# Patient Record
Sex: Female | Born: 1995 | Race: White | Hispanic: No | Marital: Married | Smoking: Never smoker
Health system: Southern US, Community
[De-identification: ages and names within clinical notes are randomized; demographics above are authoritative.]

## PROBLEM LIST (undated history)

## (undated) DIAGNOSIS — F909 Attention-deficit hyperactivity disorder, unspecified type: Secondary | ICD-10-CM

## (undated) DIAGNOSIS — J45909 Unspecified asthma, uncomplicated: Secondary | ICD-10-CM

## (undated) DIAGNOSIS — R000 Tachycardia, unspecified: Secondary | ICD-10-CM

## (undated) DIAGNOSIS — E063 Autoimmune thyroiditis: Secondary | ICD-10-CM

## (undated) DIAGNOSIS — I10 Essential (primary) hypertension: Secondary | ICD-10-CM

## (undated) DIAGNOSIS — E119 Type 2 diabetes mellitus without complications: Secondary | ICD-10-CM

## (undated) HISTORY — PX: WISDOM TOOTH EXTRACTION: SHX21

---

## 2020-09-29 ENCOUNTER — Emergency Department

## 2020-09-29 ENCOUNTER — Other Ambulatory Visit: Payer: Self-pay

## 2020-09-29 ENCOUNTER — Encounter: Payer: Self-pay | Admitting: Emergency Medicine

## 2020-09-29 ENCOUNTER — Emergency Department
Admission: EM | Admit: 2020-09-29 | Discharge: 2020-09-29 | Disposition: A | Discharge status: Home / Self Care | Attending: Emergency Medicine | Admitting: Emergency Medicine

## 2020-09-29 DIAGNOSIS — S199XXA Unspecified injury of neck, initial encounter: Secondary | ICD-10-CM | POA: Insufficient documentation

## 2020-09-29 DIAGNOSIS — S0990XA Unspecified injury of head, initial encounter: Secondary | ICD-10-CM | POA: Diagnosis not present

## 2020-09-29 DIAGNOSIS — E119 Type 2 diabetes mellitus without complications: Secondary | ICD-10-CM | POA: Diagnosis not present

## 2020-09-29 DIAGNOSIS — I10 Essential (primary) hypertension: Secondary | ICD-10-CM | POA: Insufficient documentation

## 2020-09-29 DIAGNOSIS — Y92410 Unspecified street and highway as the place of occurrence of the external cause: Secondary | ICD-10-CM | POA: Insufficient documentation

## 2020-09-29 DIAGNOSIS — G44319 Acute post-traumatic headache, not intractable: Secondary | ICD-10-CM

## 2020-09-29 DIAGNOSIS — J45909 Unspecified asthma, uncomplicated: Secondary | ICD-10-CM | POA: Insufficient documentation

## 2020-09-29 DIAGNOSIS — S161XXA Strain of muscle, fascia and tendon at neck level, initial encounter: Secondary | ICD-10-CM

## 2020-09-29 HISTORY — DX: Unspecified asthma, uncomplicated: J45.909

## 2020-09-29 HISTORY — DX: Autoimmune thyroiditis: E06.3

## 2020-09-29 HISTORY — DX: Type 2 diabetes mellitus without complications: E11.9

## 2020-09-29 HISTORY — DX: Attention-deficit hyperactivity disorder, unspecified type: F90.9

## 2020-09-29 HISTORY — DX: Essential (primary) hypertension: I10

## 2020-09-29 HISTORY — DX: Tachycardia, unspecified: R00.0

## 2020-09-29 MED ORDER — METHOCARBAMOL 500 MG PO TABS: 500.0000 mg | ORAL_TABLET | Freq: Four times a day (QID) | ORAL | 0 refills | Status: AC

## 2020-09-29 MED ORDER — MELOXICAM 15 MG PO TABS: 15.0000 mg | ORAL_TABLET | Freq: Every day | ORAL | 0 refills | Status: AC
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
## 2020-09-29 NOTE — ED Notes (Signed)
Pt calm , collective , denied pain or sob  
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
## 2022-08-02 IMAGING — CT CT CERVICAL SPINE W/O CM
3 of 4 series · 12 of 34 positions shown, 14 images · non-contrast
Comparison: None.

CLINICAL DATA: Polytrauma, critical, head/C-spine injury suspected

EXAM:
CT HEAD WITHOUT CONTRAST
CT CERVICAL SPINE WITHOUT CONTRAST
TECHNIQUE: Multidetector CT imaging of the head and cervical spine was
performed following the standard protocol without intravenous
contrast. Multiplanar CT image reconstructions of the cervical spine
were also generated.

[Series 6: orthogonal bone · axial · 0.26mm/px · z∈[-218,-90]mm · 4 of 104 slices shown, 5 images]
[im 18/104  soft-tissue]
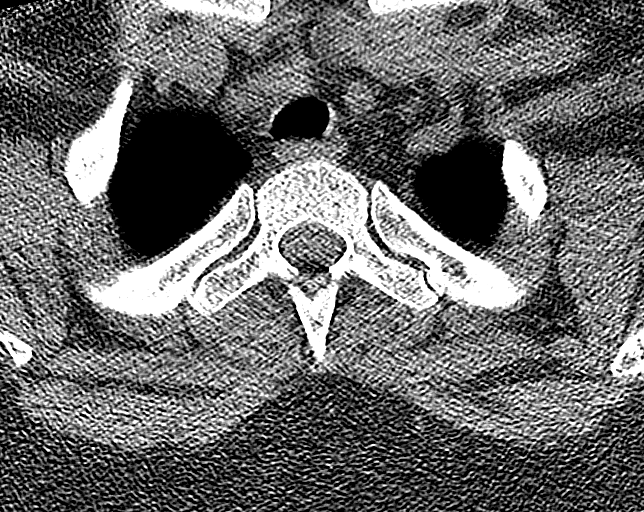
[im 18/104  bone]
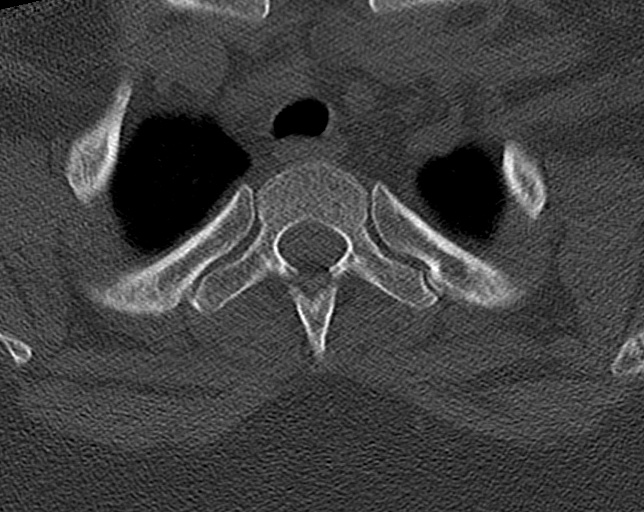
[im 35/104  bone]
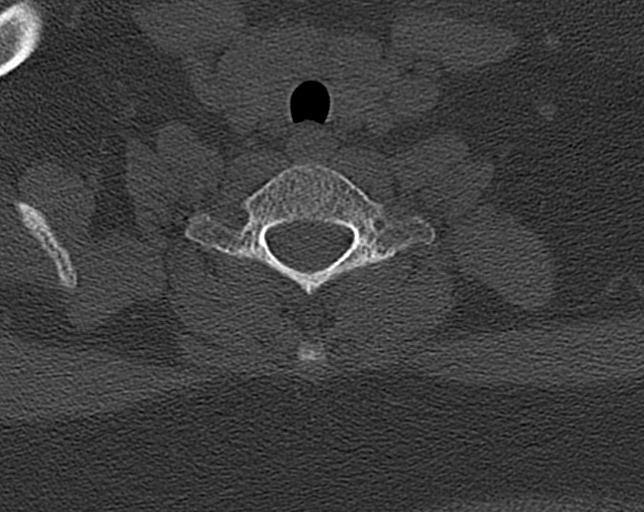
[im 69/104  bone]
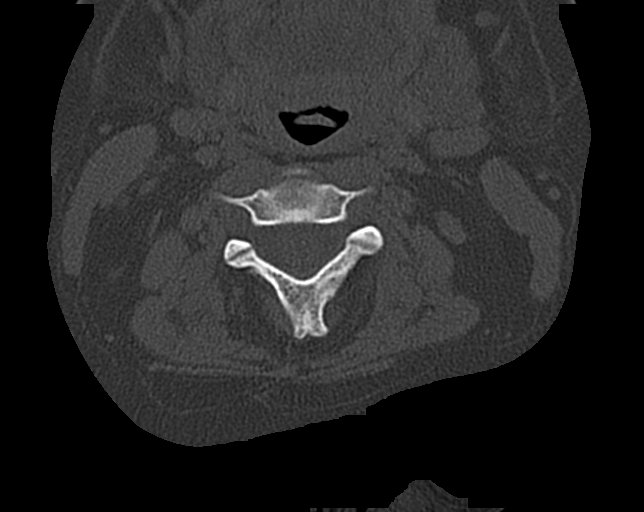
[im 86/104  bone]
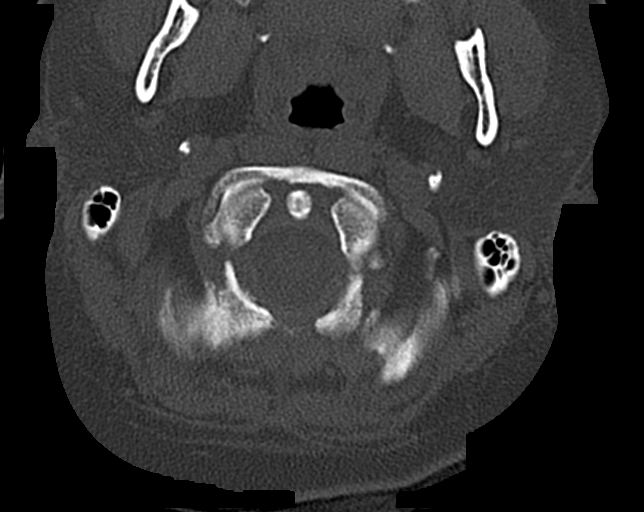

[Series 7: sagittal bone · sagittal · 0.28mm/px · 5 of 83 slices shown, 6 images]
[im 28/83  bone]
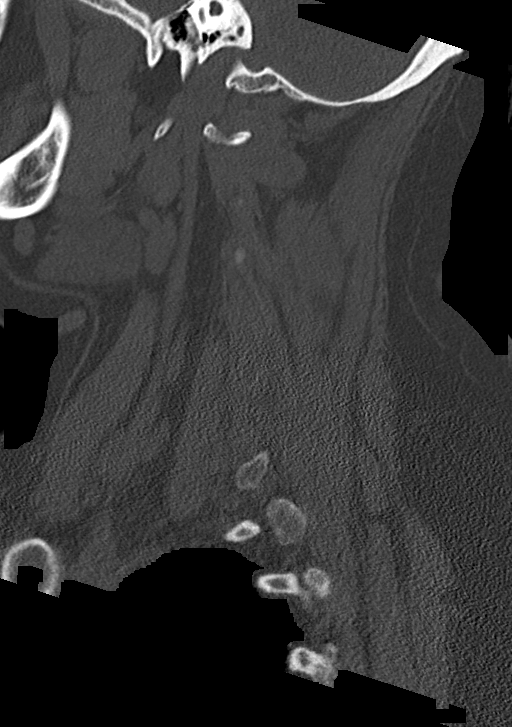
[im 35/83  bone]
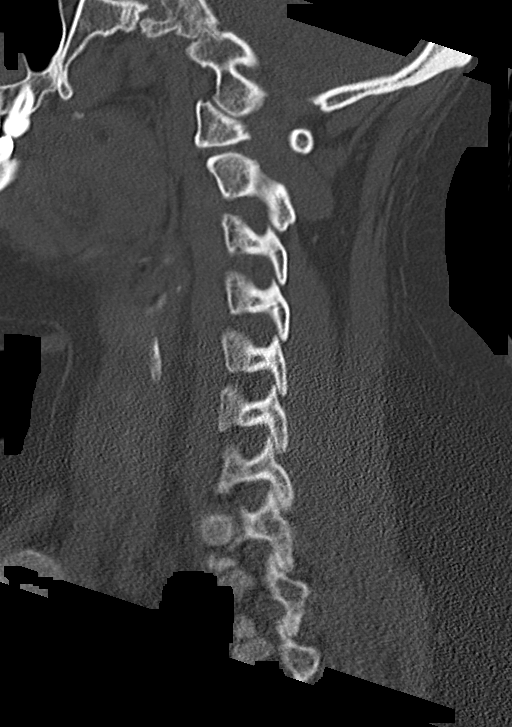
[im 42/83  soft-tissue]
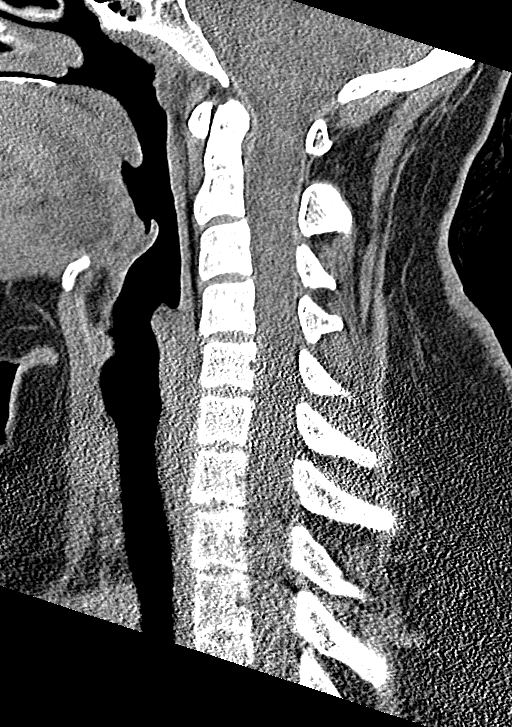
[im 42/83  bone]
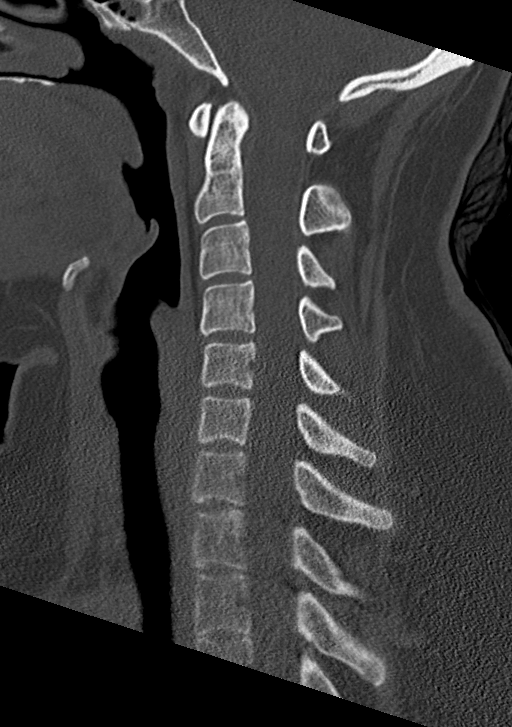
[im 48/83  bone]
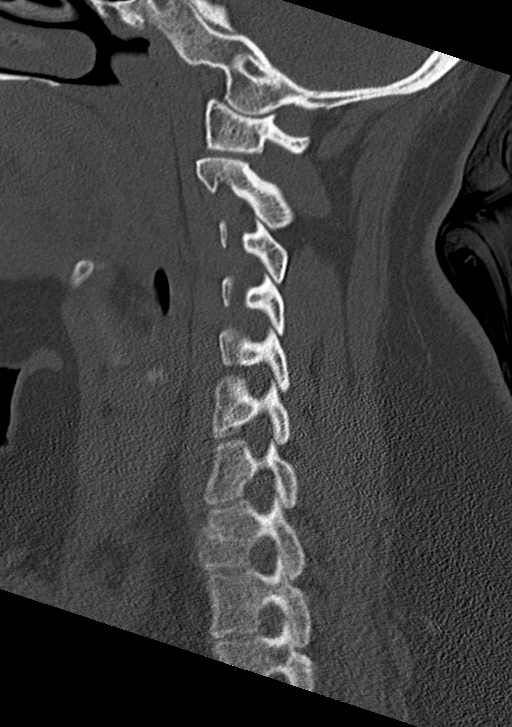
[im 55/83  bone]
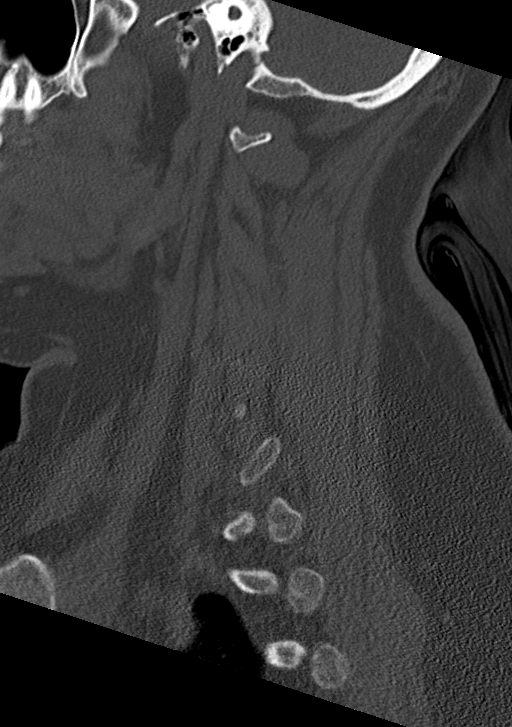

[Series 8: coronal bone · coronal · 0.31mm/px · 3 of 63 slices shown]
[im 13/63  bone]
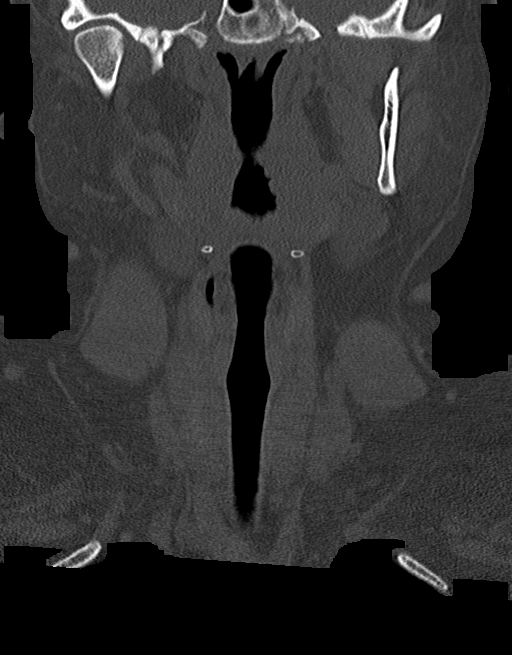
[im 25/63  bone]
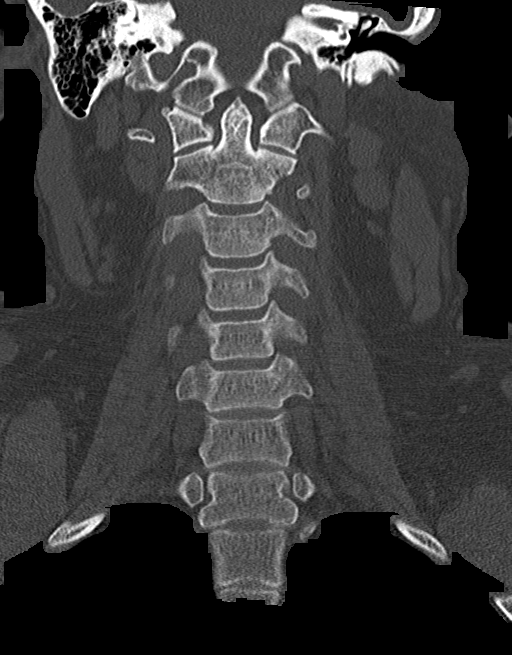
[im 38/63  bone]
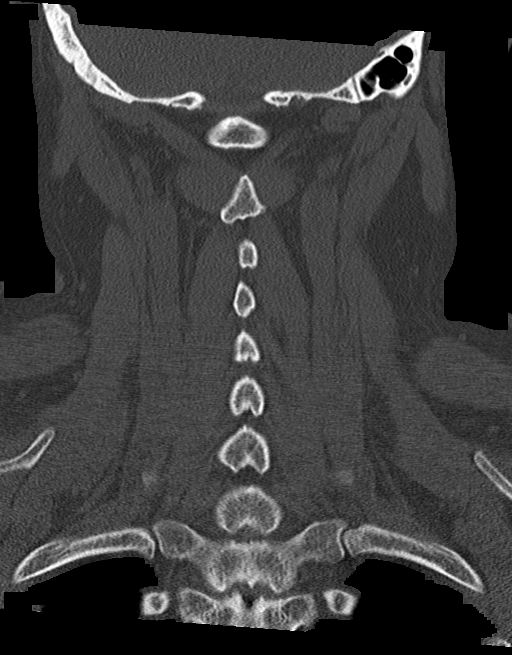

[12 of 34 positions shown; findings below may reference images not displayed]

FINDINGS: CT HEAD FINDINGS

Brain:

No evidence of large-territorial acute infarction. No parenchymal
hemorrhage. No mass lesion. No extra-axial collection.

No mass effect or midline shift. No hydrocephalus. Basilar cisterns
are patent.

Vascular: No hyperdense vessel.

Skull: No acute fracture or focal lesion.

Sinuses/Orbits: Left sphenoid sinus mucosal thickening. Otherwise
remaining paranasal sinuses and mastoid air cells are clear. The
orbits are unremarkable.

Other: None.

CT CERVICAL SPINE FINDINGS

Alignment: Normal.

Skull base and vertebrae: No acute fracture. No aggressive appearing
focal osseous lesion or focal pathologic process.

Soft tissues and spinal canal: No prevertebral fluid or swelling. No
visible canal hematoma.

Upper chest: Unremarkable.

Other: None.
IMPRESSION: 1. No acute intracranial abnormality.
2. No acute displaced fracture or traumatic listhesis of the
cervical spine.
